# Patient Record
Sex: Female | Born: 1970 | Race: White | Hispanic: No | Smoking: Never smoker
Health system: Southern US, Community
[De-identification: ages and names within clinical notes are randomized; demographics above are authoritative.]

## PROBLEM LIST (undated history)

## (undated) DIAGNOSIS — R200 Anesthesia of skin: Secondary | ICD-10-CM

## (undated) HISTORY — DX: Anesthesia of skin: R20.0

---

## 1997-12-30 ENCOUNTER — Other Ambulatory Visit: Admission: RE | Admit: 1997-12-30 | Discharge: 1997-12-30 | Payer: Self-pay | Admitting: Obstetrics and Gynecology

## 1998-02-11 ENCOUNTER — Inpatient Hospital Stay (HOSPITAL_COMMUNITY): Admission: AD | Admit: 1998-02-11 | Discharge: 1998-02-11 | Payer: Self-pay | Admitting: Gynecology

## 1998-07-28 ENCOUNTER — Inpatient Hospital Stay (HOSPITAL_COMMUNITY): Admission: AD | Admit: 1998-07-28 | Discharge: 1998-07-30 | Payer: Self-pay | Admitting: Obstetrics and Gynecology

## 1999-08-19 ENCOUNTER — Other Ambulatory Visit: Admission: RE | Admit: 1999-08-19 | Discharge: 1999-08-19 | Payer: Self-pay | Admitting: Obstetrics and Gynecology

## 2001-10-20 ENCOUNTER — Encounter: Payer: Self-pay | Admitting: Obstetrics and Gynecology

## 2001-10-20 ENCOUNTER — Ambulatory Visit (HOSPITAL_COMMUNITY): Admission: RE | Admit: 2001-10-20 | Discharge: 2001-10-20 | Payer: Self-pay | Admitting: Obstetrics and Gynecology

## 2001-10-31 ENCOUNTER — Other Ambulatory Visit: Admission: RE | Admit: 2001-10-31 | Discharge: 2001-10-31 | Payer: Self-pay | Admitting: Obstetrics and Gynecology

## 2002-02-16 ENCOUNTER — Observation Stay (HOSPITAL_COMMUNITY): Admission: AD | Admit: 2002-02-16 | Discharge: 2002-02-17 | Payer: Self-pay | Admitting: Obstetrics and Gynecology

## 2002-02-18 ENCOUNTER — Inpatient Hospital Stay (HOSPITAL_COMMUNITY): Admission: AD | Admit: 2002-02-18 | Discharge: 2002-02-18 | Payer: Self-pay | Admitting: Obstetrics and Gynecology

## 2002-03-05 ENCOUNTER — Inpatient Hospital Stay (HOSPITAL_COMMUNITY): Admission: AD | Admit: 2002-03-05 | Discharge: 2002-03-05 | Payer: Self-pay | Admitting: Obstetrics and Gynecology

## 2002-03-15 ENCOUNTER — Inpatient Hospital Stay (HOSPITAL_COMMUNITY): Admission: AD | Admit: 2002-03-15 | Discharge: 2002-03-17 | Payer: Self-pay | Admitting: Obstetrics and Gynecology

## 2002-12-20 ENCOUNTER — Inpatient Hospital Stay (HOSPITAL_COMMUNITY): Admission: AD | Admit: 2002-12-20 | Discharge: 2002-12-21 | Payer: Self-pay | Admitting: Obstetrics and Gynecology

## 2005-05-11 ENCOUNTER — Emergency Department (HOSPITAL_COMMUNITY): Admission: EM | Admit: 2005-05-11 | Discharge: 2005-05-11 | Payer: Self-pay | Admitting: Emergency Medicine

## 2006-10-12 ENCOUNTER — Inpatient Hospital Stay: Payer: Self-pay | Admitting: Obstetrics and Gynecology

## 2008-03-17 IMAGING — CR DG CHEST 2V
2 series · 2 of 2 positions shown · non-contrast
Comparison: None.

CLINICAL DATA: 34 year-old-female with cough, fever. Flu symptoms. 
 CHEST - 2 VIEW ? 05/11/05:

[w chest pa]
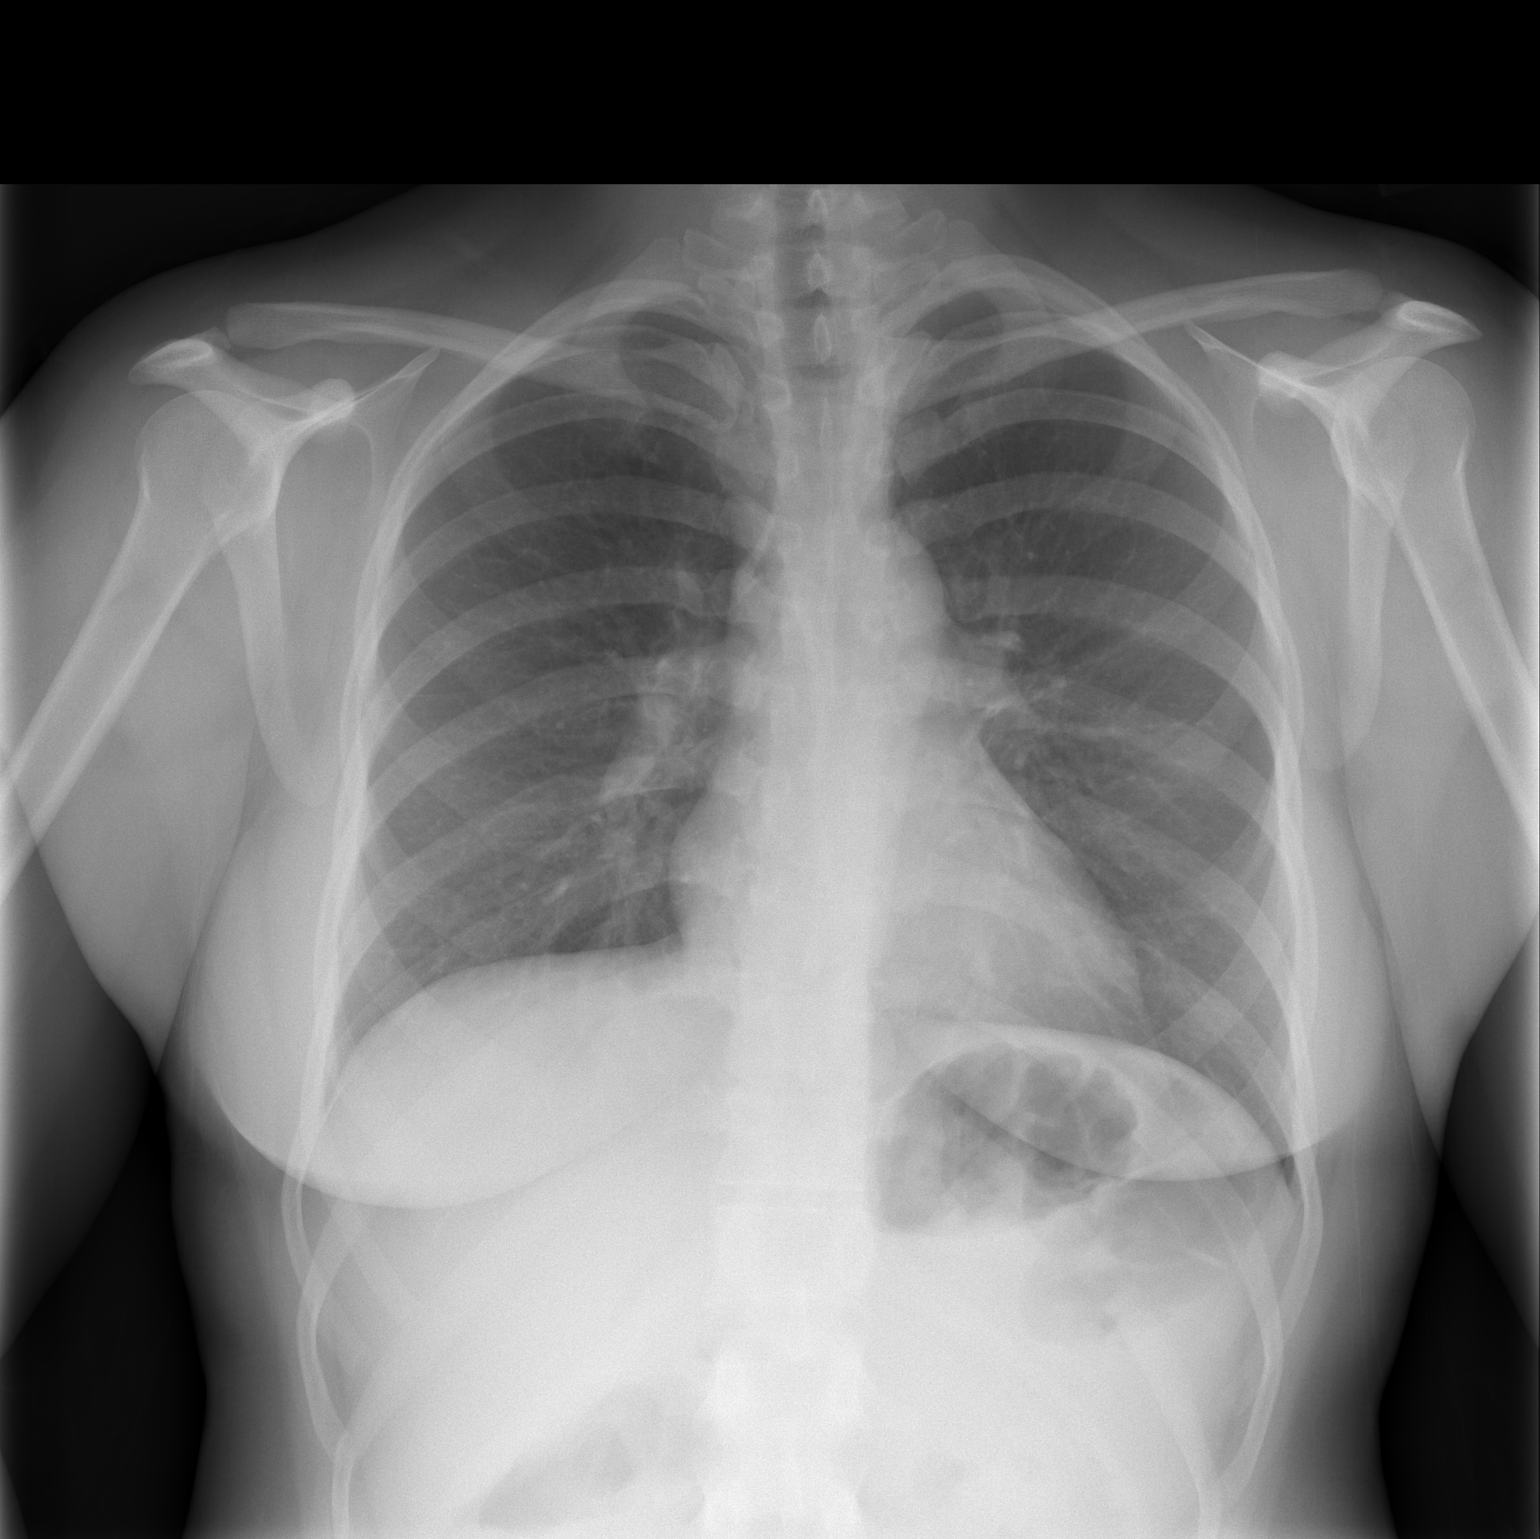

[w chest lat]
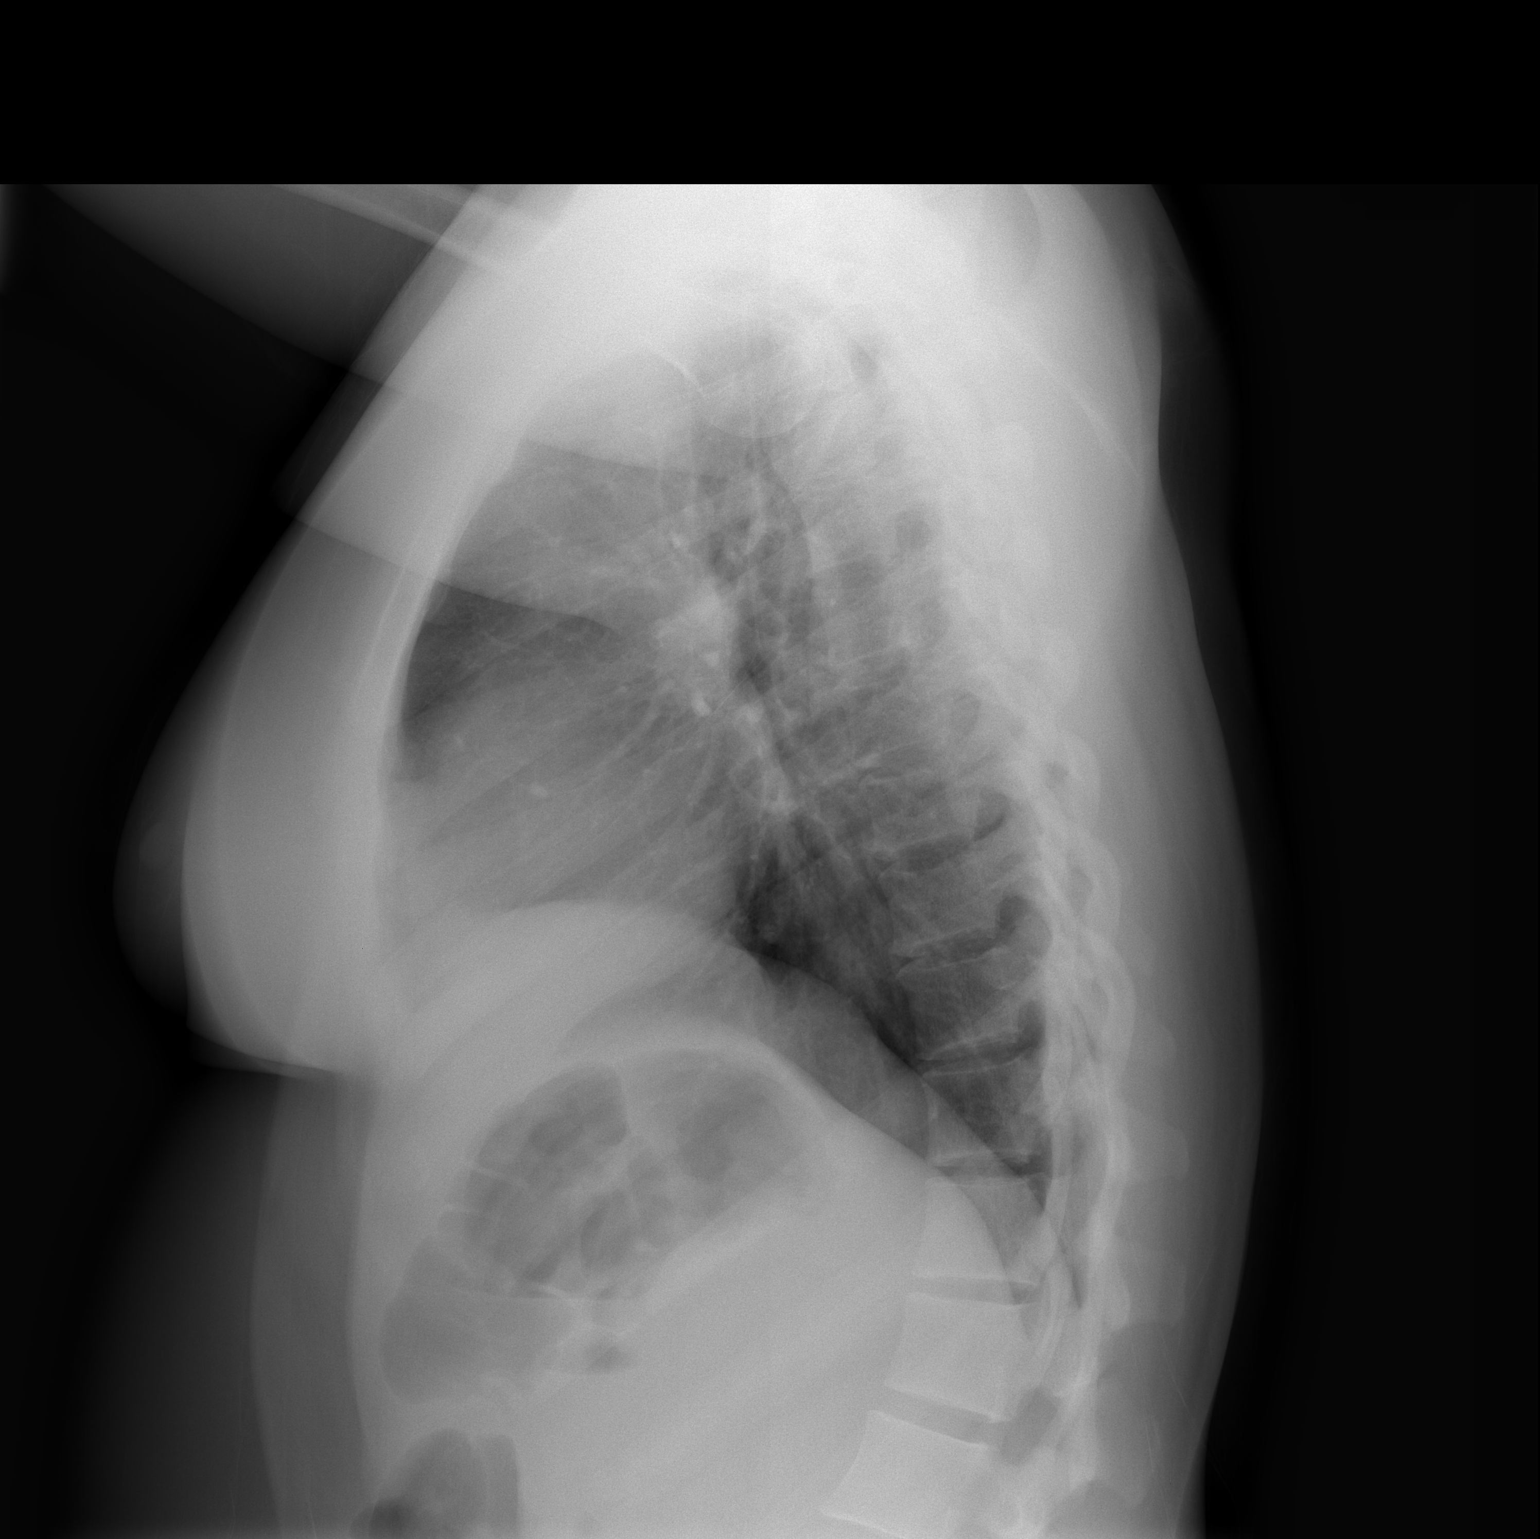

[2 of 2 positions shown; findings below may reference images not displayed]

FINDINGS: The cardiac silhouette, mediastinal and hilar contours are within normal limits.  The lungs are clear.  Bony structures are intact.
IMPRESSION: No acute cardiopulmonary findings.

## 2015-08-25 ENCOUNTER — Emergency Department (HOSPITAL_COMMUNITY)
Admission: EM | Admit: 2015-08-25 | Discharge: 2015-08-25 | Disposition: A | Discharge status: Home / Self Care | Payer: Self-pay | Attending: Emergency Medicine | Admitting: Emergency Medicine

## 2015-08-25 ENCOUNTER — Emergency Department (HOSPITAL_COMMUNITY): Payer: Self-pay

## 2015-08-25 ENCOUNTER — Encounter (HOSPITAL_COMMUNITY): Payer: Self-pay | Admitting: Emergency Medicine

## 2015-08-25 DIAGNOSIS — R002 Palpitations: Secondary | ICD-10-CM | POA: Insufficient documentation

## 2015-08-25 DIAGNOSIS — R51 Headache: Secondary | ICD-10-CM | POA: Insufficient documentation

## 2015-08-25 DIAGNOSIS — R202 Paresthesia of skin: Secondary | ICD-10-CM | POA: Insufficient documentation

## 2015-08-25 DIAGNOSIS — R519 Headache, unspecified: Secondary | ICD-10-CM

## 2015-08-25 LAB — BASIC METABOLIC PANEL
Anion gap: 9 (ref 5–15)
BUN: 12 mg/dL (ref 6–20)
CALCIUM: 9.3 mg/dL (ref 8.9–10.3)
CO2: 20 mmol/L — AB (ref 22–32)
CREATININE: 0.77 mg/dL (ref 0.44–1.00)
Chloride: 108 mmol/L (ref 101–111)
GFR calc Af Amer: >60 mL/min (ref >60)
GFR calc non Af Amer: >60 mL/min (ref >60)
GLUCOSE: 127 mg/dL — AB (ref 65–99)
Potassium: 3.8 mmol/L (ref 3.5–5.1)
Sodium: 137 mmol/L (ref 135–145)

## 2015-08-25 LAB — I-STAT TROPONIN, ED: TROPONIN I, POC: 0 ng/mL (ref 0.00–0.08)

## 2015-08-25 LAB — CBC
HCT: 40.6 % (ref 36.0–46.0)
Hemoglobin: 12.8 g/dL (ref 12.0–15.0)
MCH: 29 pg (ref 26.0–34.0)
MCHC: 31.5 g/dL (ref 30.0–36.0)
MCV: 92.1 fL (ref 78.0–100.0)
PLATELETS: 318 10*3/uL (ref 150–400)
RBC: 4.41 MIL/uL (ref 3.87–5.11)
RDW: 13.7 % (ref 11.5–15.5)
WBC: 12.9 10*3/uL — ABNORMAL HIGH (ref 4.0–10.5)

## 2015-08-25 MED ORDER — SODIUM CHLORIDE 0.9 % IV BOLUS (SEPSIS)
1000.0000 mL | Freq: Once | INTRAVENOUS | Status: AC
  Administered 2015-08-25: 1000 mL via INTRAVENOUS

## 2015-08-25 MED ORDER — KETOROLAC TROMETHAMINE 15 MG/ML IJ SOLN
15.0000 mg | Freq: Once | INTRAMUSCULAR | Status: AC
  Administered 2015-08-25: 15 mg via INTRAVENOUS
  Filled 2015-08-25: qty 1

## 2015-08-25 NOTE — ED Notes (Signed)
Pt taken to xray 

## 2015-08-25 NOTE — ED Notes (Signed)
Pt. Finishing her fluid bolus and will be d/c afterwards.

## 2015-08-25 NOTE — ED Provider Notes (Signed)
[facility]EM[facility]ENCY DEPT Provider Note   CSN: [id number] Arrival date & time: 08/25/15  [id number]  First Provider Contact:  None       History   Chief Complaint Chief Complaint  Patient presents with  . Other    Chest fluttering, bilateral arm numbness    HPI Alyssa Moss is a 45 y.o. female.  HPI  This is a 45 year old female with no significant past medical history who presents with palpitations and arm tingling. Patient reports that she woke up out of sleep and noted bilateral hand and arm tingling. She states that she got anxious and felt a fluttering in her left chest and dizziness. She's never had symptoms like this before. She denies room spinning dizziness. She states off balance. She denies symptoms before going to bed. She denies any recent illness. She does report headache.  She reports that she sometimes gets headaches. Current headache is 5 out of 10. She's not taken anything for her headache. She denies any chest pain or shortness of breath during this episode. She denies any weakness.  History reviewed. No pertinent past medical history.  There are no active problems to display for this patient.   History reviewed. No pertinent surgical history.  OB History    No data available       Home Medications    Prior to Admission medications   Not on File    Family History No family history on file.  Social History Social History  Substance Use Topics  . Smoking status: Never Smoker  . Smokeless tobacco: Never Used  . Alcohol use No     Allergies   Review of patient's allergies indicates no known allergies.   Review of Systems Review of Systems  Constitutional: Negative for fever.  Eyes: Positive for photophobia.  Respiratory: Negative for chest tightness and shortness of breath.   Cardiovascular: Positive for palpitations. Negative for chest pain.  Gastrointestinal: Negative for abdominal pain, nausea and vomiting.  Musculoskeletal: Negative for neck  pain.  Neurological: Positive for headaches.       Tingling  All other systems reviewed and are negative.    Physical Exam Updated Vital Signs BP 107/76   Pulse 65   Temp 98.5 F (36.9 C) (Oral)   Resp 18   Ht 5\' 5"  (1.651 m)   Wt 214 lb 4 oz (97.2 kg)   LMP 08/01/2015 (Exact Date)   SpO2 99%   BMI 35.65 kg/m   Physical Exam  Constitutional: She is oriented to person, place, and time. She appears well-developed and well-nourished. No distress.  HENT:  Head: Normocephalic and atraumatic.  Eyes: Pupils are equal, round, and reactive to light.  Neck: Normal range of motion. Neck supple.  Cardiovascular: Normal rate, regular rhythm and normal heart sounds.   Pulmonary/Chest: Effort normal and breath sounds normal. No respiratory distress. She has no wheezes.  Abdominal: Soft. There is no tenderness.  Neurological: She is alert and oriented to person, place, and time.  Cranial nerves II through XII intact, 5 out of 5 strength in all 4 extremities, normal gait  Skin: Skin is warm and dry.  Psychiatric: She has a normal mood and affect.  Nursing note and vitals reviewed.    ED Treatments / Results  Labs (all labs ordered are listed, but only abnormal results are displayed) Labs Reviewed  BASIC METABOLIC PANEL - Abnormal; Notable for the following:       Result Value   CO2 20 (*)  Glucose, Bld 127 (*)    All other components within normal limits  CBC - Abnormal; Notable for the following:    WBC 12.9 (*)    All other components within normal limits  I-STAT TROPOININ, ED    EKG  EKG Interpretation  Date/Time:  Monday August 25 2015 04:56:09 EDT Ventricular Rate:  92 PR Interval:  138 QRS Duration: 70 QT Interval:  350 QTC Calculation: 432 R Axis:   24 Text Interpretation:  Normal sinus rhythm Low voltage QRS Cannot rule out Anterior infarct , age undetermined Abnormal ECG no prior for comparison Confirmed by Jowana Thumma  MD, Sarrah Fiorenza ([id number]) on 08/25/2015 5:06:59  AM       Radiology Dg Chest 2 View  Result Date: 08/25/2015 CLINICAL DATA:  Woke up this morning gasping for breath. Migraine headache. Heart fluttering. EXAM: CHEST  2 VIEW COMPARISON:  05/11/2005 FINDINGS: The heart size and mediastinal contours are within normal limits. Both lungs are clear. The visualized skeletal structures are unremarkable. IMPRESSION: No active cardiopulmonary disease. Electronically Signed   By: Burman NievesWilliam  Stevens M.D.   On: 08/25/2015 05:43    Procedures Procedures (including critical care time)  Medications Ordered in ED Medications  sodium chloride 0.9 % bolus 1,000 mL (1,000 mLs Intravenous New Bag/Given 08/25/15 0644)  ketorolac (TORADOL) 15 MG/ML injection 15 mg (15 mg Intravenous Given 08/25/15 0644)     Initial Impression / Assessment and Plan / ED Course  I have reviewed the triage vital signs and the nursing notes.  Pertinent labs & imaging results that were available during my care of the patient were reviewed by me and considered in my medical decision making (see chart for details).  Clinical Course    Patient presents with peers seizures, palpitations, and headache. This was upon awakening. Denies any chest pain or shortness of breath with this event. He reports that symptoms may have been related to anxiety; however, she has never had any symptoms like this previously. Vital signs are reassuring. Orthostatics are negative. No arrhythmia noted.  Neurologically intact. Patient was given fluids and Toradol for her headache and dizziness. Paresthesias have improved. They're bilateral. Doubt stroke or cervical pathology.  Patient reports that she feels much better. We'll have her follow-up with her primary physician.  After history, exam, and medical workup I feel the patient has been appropriately medically screened and is safe for discharge home. Pertinent diagnoses were discussed with the patient. Patient was given return precautions.   Final  Clinical Impressions(s) / ED Diagnoses   Final diagnoses:  Paresthesia  Palpitations  Acute nonintractable headache, unspecified headache type    New Prescriptions New Prescriptions   No medications on file     Shon Batonourtney F Alizandra Loh, MD 08/25/15 [phone]

## 2015-08-25 NOTE — ED Triage Notes (Signed)
Pt reports she woke up just prior to arrival with "fluttering" feeling to L chest, bilateral arm numbness, blurred vision to bilateral eyes, and feeling dizzy.  Denies chest pain.  Reports she went to bed without any symptoms at 10pm.

## 2015-08-25 NOTE — Discharge Instructions (Signed)
You were seen today for palpitations and tingling in your fingers. He also had a headache. Your workup is reassuring. This may be related to underlying anxiety; however, if you have recurrence of symptoms or persistent symptoms she needs to see her primary physician for recheck.

## 2016-06-16 ENCOUNTER — Emergency Department (HOSPITAL_COMMUNITY)
Admission: EM | Admit: 2016-06-16 | Discharge: 2016-06-16 | Disposition: A | Discharge status: Home / Self Care | Payer: Medicaid Other | Attending: Emergency Medicine | Admitting: Emergency Medicine

## 2016-06-16 ENCOUNTER — Encounter (HOSPITAL_COMMUNITY): Payer: Self-pay

## 2016-06-16 DIAGNOSIS — H9191 Unspecified hearing loss, right ear: Secondary | ICD-10-CM | POA: Insufficient documentation

## 2016-06-16 DIAGNOSIS — Z5321 Procedure and treatment not carried out due to patient leaving prior to being seen by health care provider: Secondary | ICD-10-CM | POA: Insufficient documentation

## 2016-06-16 DIAGNOSIS — R200 Anesthesia of skin: Secondary | ICD-10-CM | POA: Diagnosis not present

## 2016-06-16 NOTE — ED Triage Notes (Addendum)
Pt endorses having "muffled sound in right ear like there is a cotton ball in it" with numbness around the ear. No neuro deficits. VSS. Pt has no medical hx. Denies dizziness.

## 2016-06-16 NOTE — ED Notes (Signed)
Pt left post triage. Pt came to desk and asked about referrals to a primary Dr, this RN informed her that she has to see the dr first. Pt states "i'm just going to find a pcp and see if they can tell me what's going on" Pt ambulatory and appears to be in NAD.

## 2016-06-30 ENCOUNTER — Emergency Department (HOSPITAL_COMMUNITY)
Admission: EM | Admit: 2016-06-30 | Discharge: 2016-06-30 | Disposition: A | Discharge status: Home / Self Care | Payer: Medicaid Other | Attending: Emergency Medicine | Admitting: Emergency Medicine

## 2016-06-30 ENCOUNTER — Encounter (HOSPITAL_COMMUNITY): Payer: Self-pay | Admitting: Emergency Medicine

## 2016-06-30 ENCOUNTER — Encounter: Payer: Self-pay | Admitting: Neurology

## 2016-06-30 DIAGNOSIS — R202 Paresthesia of skin: Secondary | ICD-10-CM | POA: Insufficient documentation

## 2016-06-30 DIAGNOSIS — R51 Headache: Secondary | ICD-10-CM | POA: Insufficient documentation

## 2016-06-30 DIAGNOSIS — R200 Anesthesia of skin: Secondary | ICD-10-CM

## 2016-06-30 DIAGNOSIS — H9311 Tinnitus, right ear: Secondary | ICD-10-CM | POA: Insufficient documentation

## 2016-06-30 LAB — CBC WITH DIFFERENTIAL/PLATELET
Basophils Absolute: 0 10*3/uL (ref 0.0–0.1)
Basophils Relative: 0 %
EOS ABS: 0.1 10*3/uL (ref 0.0–0.7)
Eosinophils Relative: 1 %
HCT: 38.7 % (ref 36.0–46.0)
HEMOGLOBIN: 12 g/dL (ref 12.0–15.0)
LYMPHS ABS: 4 10*3/uL (ref 0.7–4.0)
Lymphocytes Relative: 30 %
MCH: 28.6 pg (ref 26.0–34.0)
MCHC: 31 g/dL (ref 30.0–36.0)
MCV: 92.1 fL (ref 78.0–100.0)
MONOS PCT: 4 %
Monocytes Absolute: 0.6 10*3/uL (ref 0.1–1.0)
NEUTROS PCT: 65 %
Neutro Abs: 8.8 10*3/uL — ABNORMAL HIGH (ref 1.7–7.7)
Platelets: 368 10*3/uL (ref 150–400)
RBC: 4.2 MIL/uL (ref 3.87–5.11)
RDW: 14.5 % (ref 11.5–15.5)
WBC: 13.5 10*3/uL — ABNORMAL HIGH (ref 4.0–10.5)

## 2016-06-30 LAB — COMPREHENSIVE METABOLIC PANEL
ALK PHOS: 90 U/L (ref 38–126)
ALT: 16 U/L (ref 14–54)
ANION GAP: 8 (ref 5–15)
AST: 19 U/L (ref 15–41)
Albumin: 3.4 g/dL — ABNORMAL LOW (ref 3.5–5.0)
BILIRUBIN TOTAL: 0.4 mg/dL (ref 0.3–1.2)
BUN: 16 mg/dL (ref 6–20)
CALCIUM: 8.8 mg/dL — AB (ref 8.9–10.3)
CO2: 24 mmol/L (ref 22–32)
Chloride: 105 mmol/L (ref 101–111)
Creatinine, Ser: 0.86 mg/dL (ref 0.44–1.00)
GFR calc non Af Amer: >60 mL/min (ref >60)
Glucose, Bld: 128 mg/dL — ABNORMAL HIGH (ref 65–99)
Potassium: 3.6 mmol/L (ref 3.5–5.1)
SODIUM: 137 mmol/L (ref 135–145)
TOTAL PROTEIN: 7.3 g/dL (ref 6.5–8.1)

## 2016-06-30 NOTE — ED Provider Notes (Signed)
[facility] Provider Note   CSN: [id number] Arrival date & time: 06/30/16  0212     History   Chief Complaint Chief Complaint  Patient presents with  . Numbness    HPI Alyssa StakesLinda D Moss is a 46 y.o. female who presents with a c/o numbness. The patient states that 1 week ago while watching TV she had sudden onset of ringing in her R ear followed by numbness in the trigeminal nerve distribution and the surrounding the ear and pinna itself along with the Top surface of her tongue. She states that it lasted about 1 hour and then resolved. She had no other neuro sxs.  Today the patient woke around 1:30 AM with the same sxs. She has an associated "slight headache"  But denies a hx of migraines. No recent illnesses or vaccinations. The patient has not seen a pcp in 7 years but denies only contributory pmh. The patient denies any facial droop, changes in vision, weakness, vertigo. Denies fevers, chills, myalgias, arthralgias. Denies DOE, SOB, chest tightness or pressure, radiation to left arm, jaw or back, or diaphoresis. Denies dysuria, flank pain, suprapubic pain, frequency, urgency, or hematuria.  Denies abdominal pain, nausea, vomiting, diarrhea or constipation.   HPI  History reviewed. No pertinent past medical history.  There are no active problems to display for this patient.   History reviewed. No pertinent surgical history.  OB History    No data available       Home Medications    Prior to Admission medications   Not on File    Family History History reviewed. No pertinent family history.  Social History Social History  Substance Use Topics  . Smoking status: Never Smoker  . Smokeless tobacco: Never Used  . Alcohol use No     Allergies   Patient has no known allergies.   Review of Systems Review of Systems Ten systems reviewed and are negative for acute change, except as noted in the HPI.    Physical Exam Updated Vital Signs BP (!) 113/55   Pulse  (!) 56   Temp 98.7 F (37.1 C) (Oral)   Resp 16   SpO2 97%   Physical Exam  Constitutional: She is oriented to person, place, and time. She appears well-developed and well-nourished. No distress.  HENT:  Head: Normocephalic and atraumatic.  Right Ear: External ear normal.  Left Ear: External ear normal.  TM normal BL  Eyes: Conjunctivae and EOM are normal. Pupils are equal, round, and reactive to light. No scleral icterus.  Neck: Normal range of motion.  Cardiovascular: Normal rate, regular rhythm and normal heart sounds.  Exam reveals no gallop and no friction rub.   No murmur heard. Pulmonary/Chest: Effort normal and breath sounds normal. No respiratory distress.  Abdominal: Soft. Bowel sounds are normal. She exhibits no distension and no mass. There is no tenderness. There is no guarding.  Neurological: She is alert and oriented to person, place, and time.  Speech is clear and goal oriented, follows commands Major Cranial nerves without deficit, no facial droop Normal strength in upper and lower extremities bilaterally including dorsiflexion and plantar flexion, strong and equal grip strength  REPORTS ABNORMAL SENSATION TO LIGHT TOUCH ON THE LEFT EAR AND THE V1 DISTRIBUTION OF THE TRIGEMINAL NERVE.  Moves extremities without ataxia, coordination intact Normal finger to nose and rapid alternating movements Neg romberg, no pronator drift Normal gait Normal heel-shin and balance   Skin: Skin is warm and dry. She is not diaphoretic.  Nursing note and vitals reviewed.    ED Treatments / Results  Labs (all labs ordered are listed, but only abnormal results are displayed) Labs Reviewed  CBC WITH DIFFERENTIAL/PLATELET - Abnormal; Notable for the following:       Result Value   WBC 13.5 (*)    Neutro Abs 8.8 (*)    All other components within normal limits  COMPREHENSIVE METABOLIC PANEL - Abnormal; Notable for the following:    Glucose, Bld 128 (*)    Calcium 8.8 (*)     Albumin 3.4 (*)    All other components within normal limits    EKG  EKG Interpretation None       Radiology No results found.  Procedures Procedures (including critical care time)  Medications Ordered in ED Medications - No data to display   Initial Impression / Assessment and Plan / ED Course  I have reviewed the triage vital signs and the nursing notes.  Pertinent labs & imaging results that were available during my care of the patient were reviewed by me and considered in my medical decision making (see chart for details).      patient with Neuro complaints. Neuro exam is without abnormality.  No Evidence of zoster. No vertigo. She needs close OP f/u with Neuro.  DDX includes schwannoma. Patient seen in shared visit with attending physician. Who agrees with assessment, work up , treatment, and plan for discharge    Final Clinical Impressions(s) / ED Diagnoses   Final diagnoses:  Numbness    New Prescriptions There are no discharge medications for this patient.    Arthor Captain, PA-C 07/10/16 1347    Derwood Kaplan, MD 07/11/16 [phone]

## 2016-06-30 NOTE — ED Triage Notes (Signed)
Patient with facial numbness on the right side of her face.  She states that she woke up with the numbness.  Patient went to bed around 2230.  Patient states that her hearing is muffled.  GCS of 15, no slurred speech, no neuro deficits.

## 2016-06-30 NOTE — Discharge Instructions (Signed)
Contact a health care provider if: °You continue to have episodes of paresthesia. °Your burning or prickling feeling gets worse when you walk. °You have pain, cramps, or dizziness. °You develop a rash. °Get help right away if: °You feel weak. °You have trouble walking or moving. °You have problems with speech, understanding, or vision. °You feel confused. °You cannot control your bladder or bowel movements. °You have numbness after an injury. °You faint. °

## 2016-07-19 ENCOUNTER — Telehealth: Payer: Self-pay | Admitting: Neurology

## 2016-07-19 ENCOUNTER — Ambulatory Visit (INDEPENDENT_AMBULATORY_CARE_PROVIDER_SITE_OTHER): Payer: Medicaid Other | Admitting: Neurology

## 2016-07-19 ENCOUNTER — Encounter: Payer: Self-pay | Admitting: Neurology

## 2016-07-19 ENCOUNTER — Other Ambulatory Visit: Payer: Self-pay | Admitting: *Deleted

## 2016-07-19 VITALS — BP 100/70 | HR 86 | Ht 65.0 in | Wt 215.1 lb

## 2016-07-19 DIAGNOSIS — R200 Anesthesia of skin: Secondary | ICD-10-CM | POA: Diagnosis not present

## 2016-07-19 DIAGNOSIS — G509 Disorder of trigeminal nerve, unspecified: Secondary | ICD-10-CM

## 2016-07-19 NOTE — Progress Notes (Signed)
[facility] Neurology [facility] Clinic Note - Initial Visit   Date: 07/19/16  Alyssa StakesLinda D Moss MRN: [id number] DOB: 1970-12-14   Dear Dr. Rhunette CroftNanavati  Thank you for your kind referral of Alyssa StakesLinda D Moss for consultation of right facial numbness. Although her history is well known to you, please allow us to reiterate it for the purpose of our medical record. The patient was accompanied to the clinic by self.   History of Present Illness: Alyssa Moss is a 46 y.o. right-handed Caucasian female with no known medical problems presenting for evaluation of right facial numbness.    Starting around April 2018, she reports having muffled hearing, as if there is something in her ear.  She has numbness over the right side of her face involving the cheek, forehead, corner of her lip and behind the ear.  When numbness is present, she cannot sense a comb over the right temporal region, or if she tries the pull the hair, she is numb to it. Symptoms are intermittent, occurring about 3-4 times per week, lasting from several hours to 3-4 days.  The first time it occurred, she was sitting and watching TV.  About 3 days later, it happened again and she became fearful and went to the ER whose notes were reviewed.  She sometimes has numbness of the tongue.  No difficulty with swallowing, drooping of the eye, double vision, or limb weakness.   She has point tenderness over the right temple region.  She also complains of right frontal headaches for the past 6 months which also occur 3-4 times, which lasts all day.  She does not have numbness of the face along with the headaches.   She denies any nausea, vomiting, photophobia, phonophobia.  She saw her eye doctor a few months ago whose evaluation was normal.     Past Medical History:  Diagnosis Date  . Facial numbness    Past Surgical History:  None  Medications:  Outpatient Encounter Prescriptions as of 07/19/2016  Medication Sig  . ibuprofen (ADVIL,MOTRIN)  200 MG tablet Take 200 mg by mouth every 6 (six) hours as needed.   No facility-administered encounter medications on file as of 07/19/2016.      Allergies: No Known Allergies  Family History: Family History  Problem Relation Age of Onset  . Stroke Mother [id number]  . Diabetes Mother   . High blood pressure Father   . Healthy Sister   . Diabetes Maternal Grandmother   . Brain cancer Maternal Uncle   . Cancer Maternal Uncle     Social History: Social History  Substance Use Topics  . Smoking status: Never Smoker  . Smokeless tobacco: Never Used  . Alcohol use 1.2 oz/week    2 Glasses of wine per week   Social History   Social History Narrative   Lives with dad in a 2 story home.  Has 5 children.  Works in [employer] caf[employer] at [employer].  Education: some college.      Review of Systems:  CONSTITUTIONAL: No fevers, chills, night sweats, or weight loss.   EYES: No visual changes or eye pain ENT: No hearing changes.  No history of nose bleeds.   RESPIRATORY: No cough, wheezing and shortness of breath.   CARDIOVASCULAR: Negative for chest pain, and palpitations.   GI: Negative for abdominal discomfort, blood in stools or black stools.  No recent change in bowel habits.   GU:  No history of incontinence.   MUSCLOSKELETAL: No history of joint  pain or swelling.  No myalgias.   SKIN: Negative for lesions, rash, and itching.   HEMATOLOGY/ONCOLOGY: Negative for prolonged bleeding, bruising easily, and swollen nodes.  No history of cancer.   ENDOCRINE: Negative for cold or heat intolerance, polydipsia or goiter.   PSYCH:  No depression or anxiety symptoms.   NEURO: As Above.   Vital Signs:  BP 100/70   Pulse 86   Ht 5\' 5"  (1.651 m)   Wt 215 lb 1 oz (97.6 kg)   SpO2 98%   BMI 35.79 kg/m  Pain Scale: 0 on a scale of 0-10   General Medical Exam:   General:  Well appearing, comfortable.   Eyes/ENT: see cranial nerve examination.   Neck: No masses appreciated.  Full range of motion without  tenderness.  No carotid bruits. Respiratory:  Clear to auscultation, good air entry bilaterally.   Cardiac:  Regular rate and rhythm, no murmur.   Extremities:  No deformities, edema, or skin discoloration.  Skin:  No rashes or lesions.  Neurological Exam: MENTAL STATUS including orientation to time, place, person, recent and remote memory, attention span and concentration, language, and fund of knowledge is normal.  Speech is not dysarthric.  CRANIAL NERVES: II:  No visual field defects.  Unremarkable fundi.   III-IV-VI: Pupils equal round and reactive to light.  Normal conjugate, extra-ocular eye movements in all directions of gaze.  No nystagmus.  No ptosis.   V:  Hyperesthesia to temperature over the right V2 > V1/V3 distribution.  Normal light touch and pin prick. Jaw jerk is absent.   VII:  Normal facial symmetry and movements.  No pathologic facial reflexes.  VIII:  Normal hearing and vestibular function.   IX-X:  Normal palatal movement.   XI:  Normal shoulder shrug and head rotation.   XII:  Normal tongue strength and range of motion, no deviation or fasciculation.  MOTOR:  No atrophy, fasciculations or abnormal movements.  No pronator drift.  Tone is normal.    Right Upper Extremity:    Left Upper Extremity:    Deltoid  5/5   Deltoid  5/5   Biceps  5/5   Biceps  5/5   Triceps  5/5   Triceps  5/5   Wrist extensors  5/5   Wrist extensors  5/5   Wrist flexors  5/5   Wrist flexors  5/5   Finger extensors  5/5   Finger extensors  5/5   Finger flexors  5/5   Finger flexors  5/5   Dorsal interossei  5/5   Dorsal interossei  5/5   Abductor pollicis  5/5   Abductor pollicis  5/5   Tone (Ashworth scale)  0  Tone (Ashworth scale)  0   Right Lower Extremity:    Left Lower Extremity:    Hip flexors  5/5   Hip flexors  5/5   Hip extensors  5/5   Hip extensors  5/5   Knee flexors  5/5   Knee flexors  5/5   Knee extensors  5/5   Knee extensors  5/5   Dorsiflexors  5/5   Dorsiflexors   5/5   Plantarflexors  5/5   Plantarflexors  5/5   Toe extensors  5/5   Toe extensors  5/5   Toe flexors  5/5   Toe flexors  5/5   Tone (Ashworth scale)  0  Tone (Ashworth scale)  0   MSRs:  Right  Left brachioradialis 2+  brachioradialis 2+  biceps 2+  biceps 2+  triceps 2+  triceps 2+  patellar 2+  patellar 2+  ankle jerk 2+  ankle jerk 2+  Hoffman no  Hoffman no  plantar response down  plantar response down   SENSORY:  Normal and symmetric perception of light touch, pinprick, vibration, and proprioception.   COORDINATION/GAIT: Normal finger-to- nose-finger.  Intact rapid alternating movements bilaterally.  Gait narrow based and stable. Tandem and stressed gait intact.   DATA: Lab Results  Component Value Date   ALT 16 06/30/2016   AST 19 06/30/2016   ALKPHOS 90 06/30/2016   BILITOT 0.4 06/30/2016   Lab Results  Component Value Date   NA 137 06/30/2016   K 3.6 06/30/2016   CL 105 06/30/2016   CO2 24 06/30/2016     IMPRESSION: Mrs. Demuro is a pleasant 46 year-old female referred for evaluation of right facial numbness.  Her exam shows hyperesthesia over the right V2 distribution. No facial weakness or abnormal reflexes.  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  She also complains of "muffled noise" and sensation of something in her ear, when these spells occur.  Unsure whether she is truly having hearing deficits during these spells, of if this is due to sensory disturbance from CN V.  She will have MRI brain wwo contrast with attention to trigeminal nerve to look for structural lesion.  If there is no structural pathology, I explained that she may need labs and/or spinal tap.  Results of MRI will determine the next step.  The duration of this appointment visit was 40 minutes of face-to-face time with the patient.  Greater than 50% of this time was spent in counseling,  explanation of diagnosis, planning of further management, and coordination of care.   Thank you for allowing me to participate in patient's care.  If I can answer any additional questions, I would be pleased to do so.    Sincerely,    Aleeha Boline K. Allena Katz, DO

## 2016-07-19 NOTE — Patient Instructions (Addendum)
MRI brain with and without contrast to evaluate the trigeminal nerve We will call you with the results and determine the next step

## 2016-07-19 NOTE — Telephone Encounter (Signed)
Maralyn SagoSarah from [facility] left a voicemail message in regards to an order put in for a MRI CB# [id number] OPT#1 then OPT#2

## 2016-07-29 ENCOUNTER — Other Ambulatory Visit: Payer: Self-pay | Admitting: Neurology

## 2016-07-31 ENCOUNTER — Other Ambulatory Visit: Payer: Medicaid Other

## 2016-08-11 ENCOUNTER — Ambulatory Visit: Payer: Medicaid Other | Admitting: Neurology

## 2018-07-01 IMAGING — DX DG CHEST 2V
2 series · 2 of 2 positions shown · non-contrast
Comparison: 05/11/2005

CLINICAL DATA: Woke up this morning gasping for breath. Migraine
headache. Heart fluttering.

EXAM:
CHEST  2 VIEW

[chest pa]
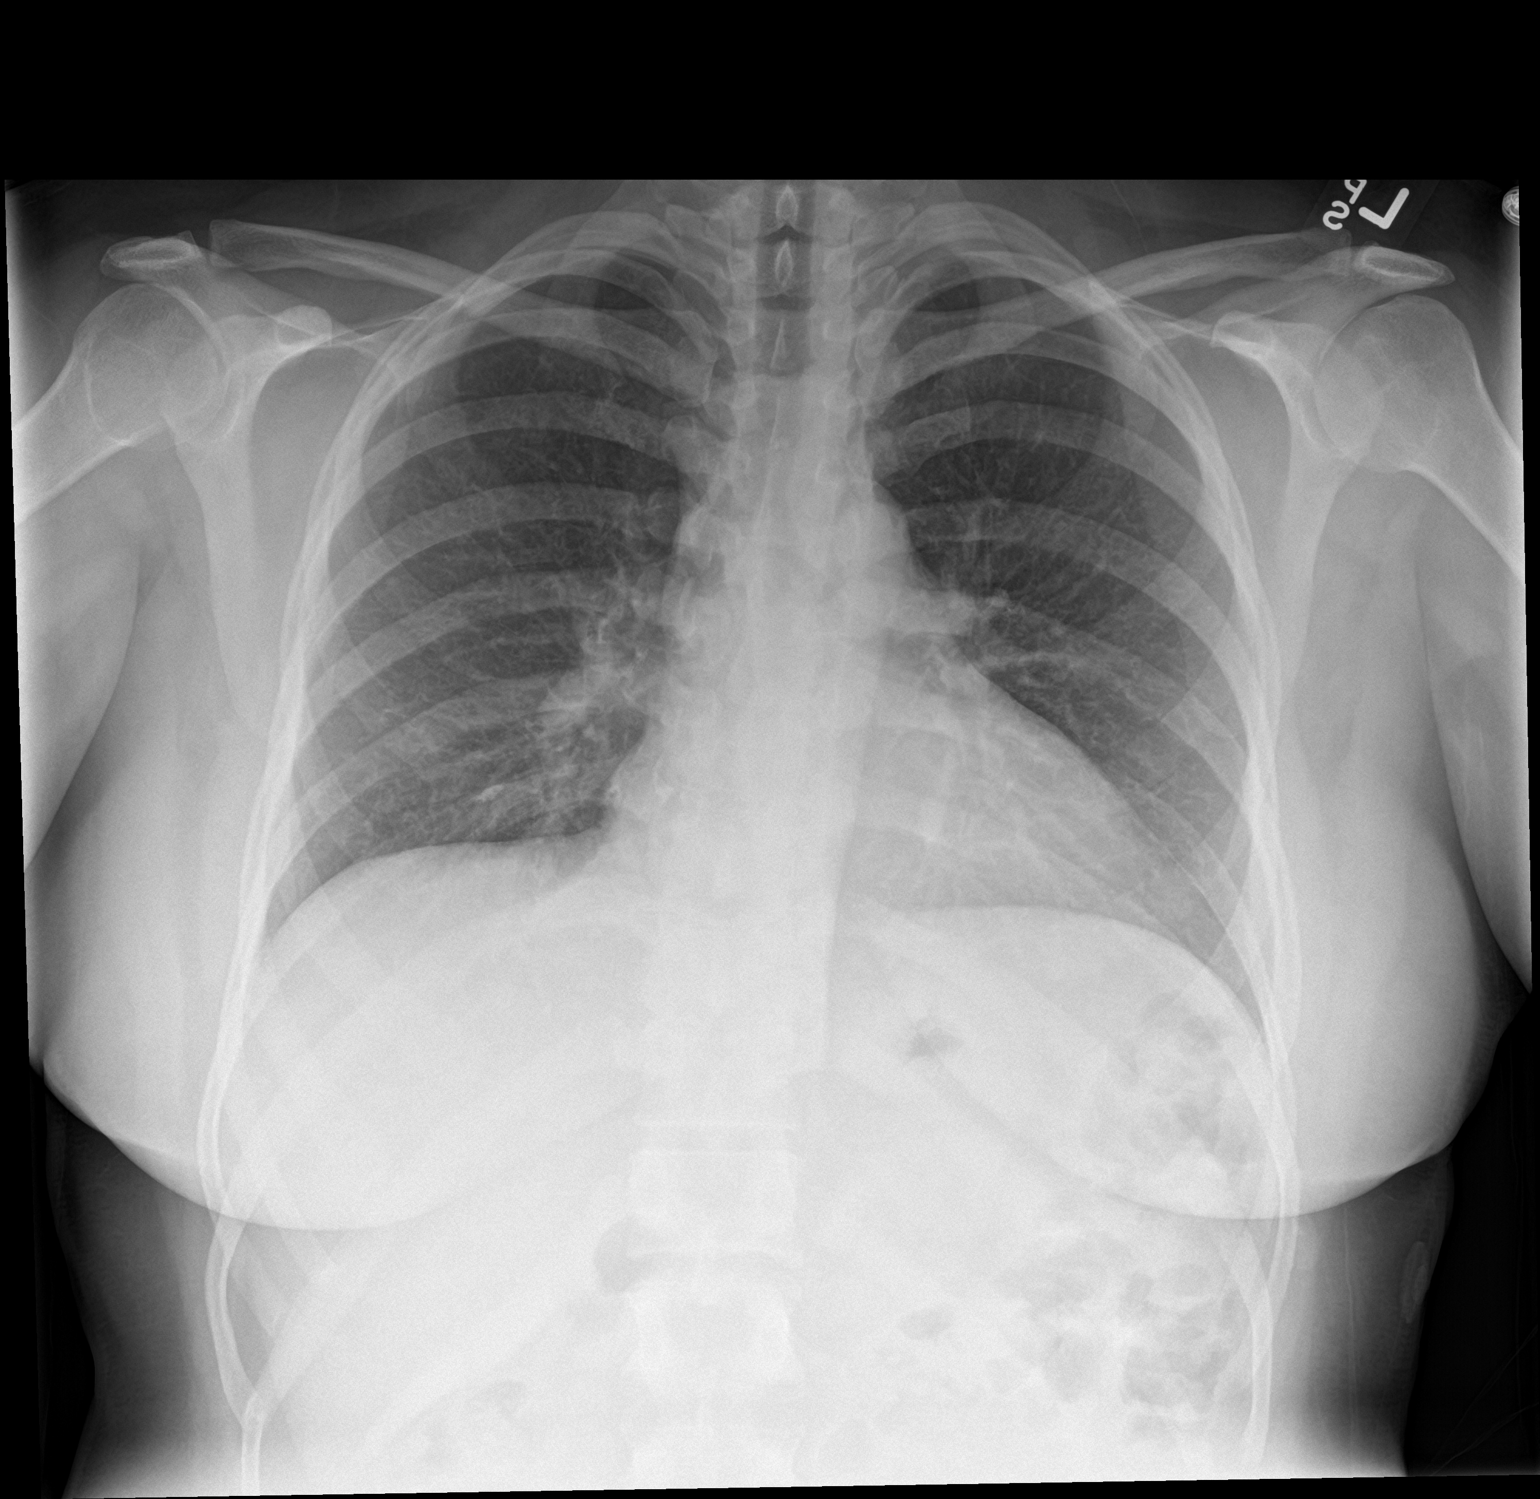

[chest lat]
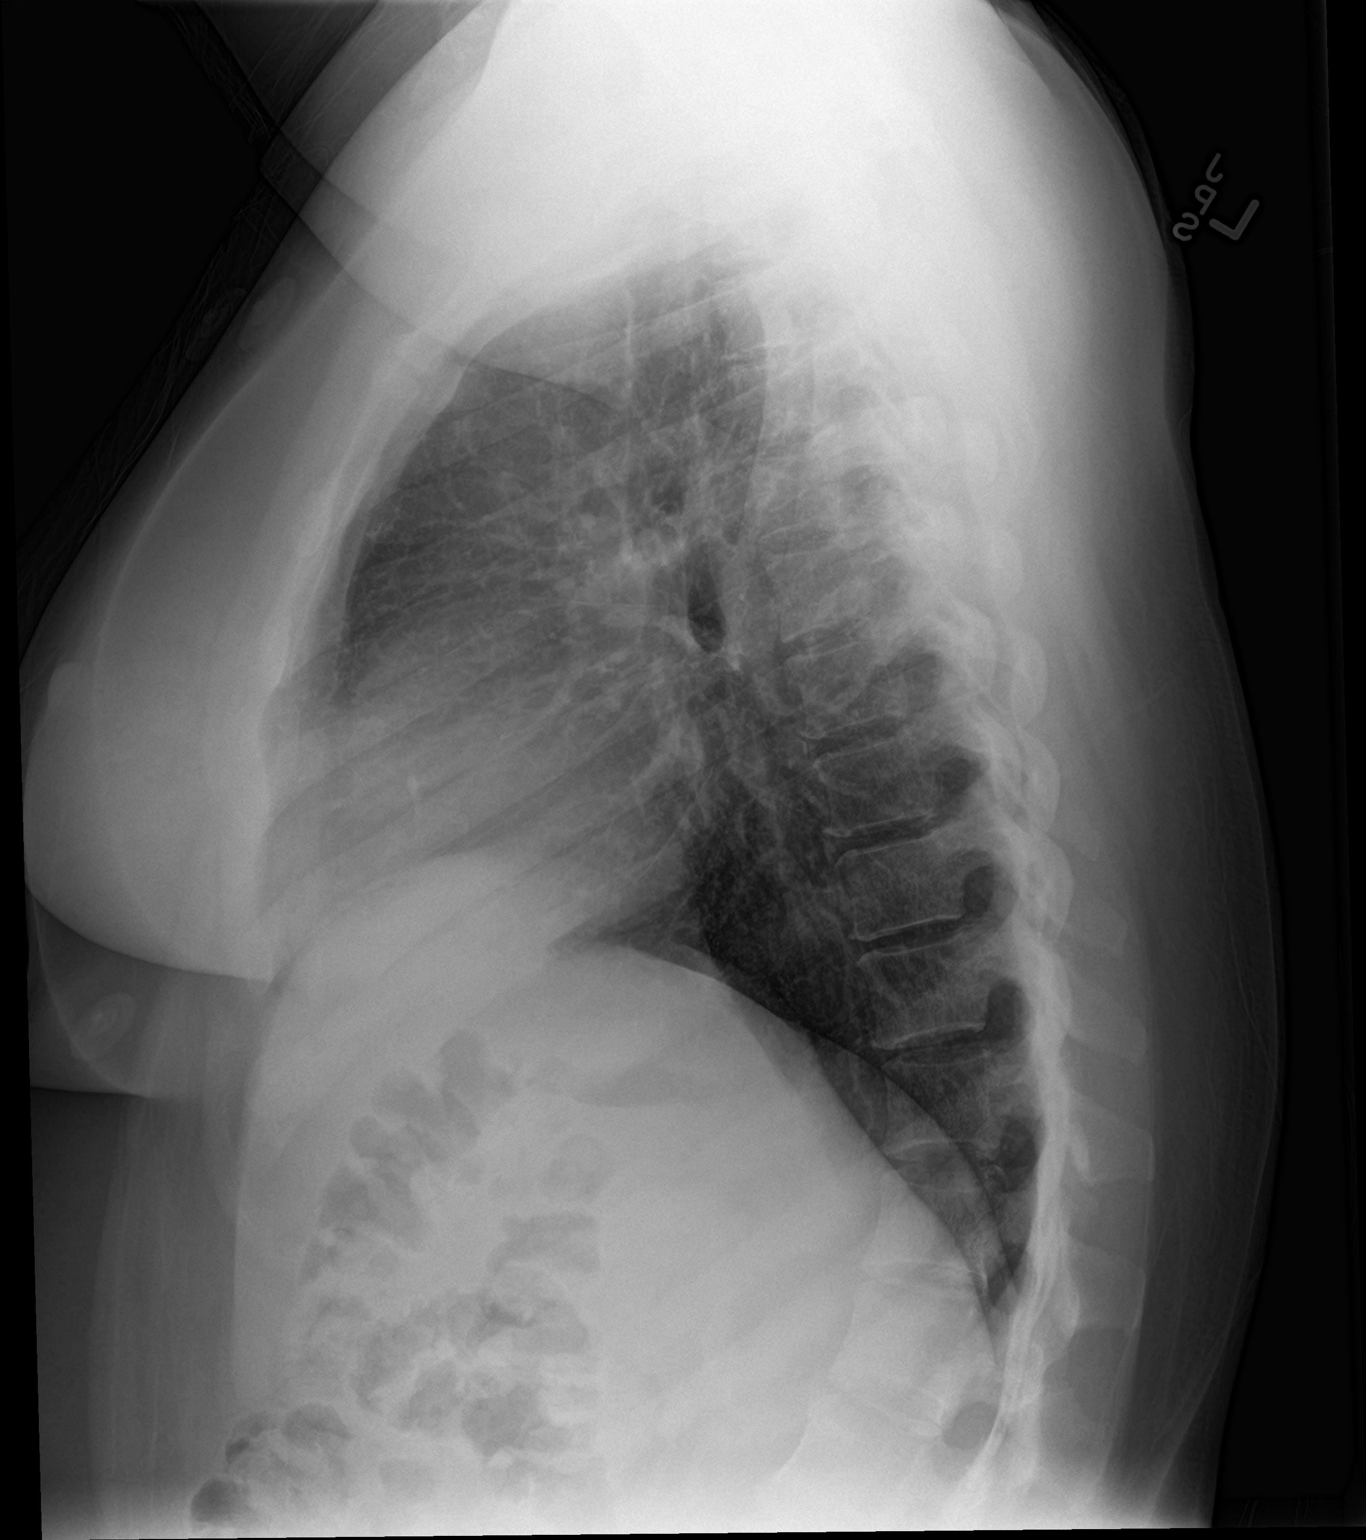

[2 of 2 positions shown; findings below may reference images not displayed]

FINDINGS: The heart size and mediastinal contours are within normal limits.
Both lungs are clear. The visualized skeletal structures are
unremarkable.
IMPRESSION: No active cardiopulmonary disease.

## 2018-09-08 ENCOUNTER — Encounter (HOSPITAL_COMMUNITY): Payer: Self-pay

## 2018-09-08 ENCOUNTER — Other Ambulatory Visit: Payer: Self-pay

## 2018-09-08 ENCOUNTER — Emergency Department (HOSPITAL_COMMUNITY)
Admission: EM | Admit: 2018-09-08 | Discharge: 2018-09-08 | Disposition: A | Discharge status: Home / Self Care | Payer: Self-pay | Attending: Emergency Medicine | Admitting: Emergency Medicine

## 2018-09-08 DIAGNOSIS — R21 Rash and other nonspecific skin eruption: Secondary | ICD-10-CM | POA: Insufficient documentation

## 2018-09-08 MED ORDER — PREDNISONE 20 MG PO TABS: 20.0000 mg | ORAL_TABLET | Freq: Every day | ORAL | 0 refills | Status: AC

## 2018-09-08 MED ORDER — CEPHALEXIN 500 MG PO CAPS: 500.0000 mg | ORAL_CAPSULE | Freq: Two times a day (BID) | ORAL | 0 refills | Status: AC

## 2018-09-08 MED ORDER — FAMOTIDINE 20 MG PO TABS: 20.0000 mg | ORAL_TABLET | Freq: Two times a day (BID) | ORAL | 0 refills | Status: AC

## 2018-09-08 NOTE — ED Triage Notes (Signed)
Patient went to clinic three weeks ago with complaints of a rash on the right arm and both sides of the neck. They gave her prednisone and cephalexin which helped; she has now run out and the rash returned. She complains of pins and needles in the rash site.

## 2018-09-08 NOTE — Discharge Instructions (Addendum)
Please read and follow all provided instructions.  1. Medications:  -Prescription sent to your pharmacy for Keflex.  This is an antibiotic.  Please take as prescribed. -Prescription also sent to your pharmacy for Pepcid.  Please take as prescribed, this should help with itching and hives -Prescription also sent for prednisone.  This is a steroid.  Please take as prescribed.   2. Treatment: rest, drink plenty of fluids, take medications as prescribed.  Please stop using lotions on the rash as we discussed  3. Follow Up: Please keep the follow-up appointment you have scheduled with [facility] physicians next week   Return to the ER for difficulty breathing, return of allergic reaction or other concerning symptoms

## 2018-09-08 NOTE — ED Provider Notes (Signed)
[facility]   CSN: [id number] Arrival date & time: 09/08/18  1711     History   Chief Complaint rash  HPI Alyssa StakesLinda D Moss is a 48 y.o. female presents emergency department today with chief complaint of rash.  Rash is located on bilateral upper extremities and her neck.  She states rash has been present x3 months.  [facility] and [facility] prednisone and Keflex on 08/19/18.  She states she took the medications as prescribed and finished them 1 week ago.  She admits to the rash improving while taking these medications however it has gotten worse over the last 3 days.  She reports itching and swelling to the rash, denies any discharge.  She has tried multiple over-the-counter creams and ointments without improvement.  She has an appointment [facility] with [facility] in 1 week for further evaluation of this rash.   Denies fever, chills, contacts with persons with similar rash, or any changes in soaps/detergents, exposure to animal or plant irritants, and denies swelling or purulent discharge. No recent travel. No recent tick bites. No involvement to palms/soles or between webspaces. Patient does not have history of immunocompromised. History provided by patient with additional history obtained from chart review.      Past Medical History:  Diagnosis Date  . Facial numbness     Patient Active Problem List   Diagnosis Date Noted  . 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 No pertinent surgical history.   OB History   No obstetric history on file.      Home Medications    Prior to Admission medications   Medication Sig Start Date End Date Taking? Authorizing Provider  cephALEXin (KEFLEX) 500 MG capsule Take 1 capsule (500 mg total) by mouth 2 (two) times daily for 7 days. 09/08/18 09/15/18  Albrizze, Kaitlyn E, PA-C  famotidine (PEPCID) 20 MG tablet Take 1 tablet (20 mg total) by mouth 2 (two)  times daily for 6 days. 09/08/18 09/14/18  Albrizze, Kaitlyn E, PA-C  ibuprofen (ADVIL,MOTRIN) 200 MG tablet Take 200 mg by mouth every 6 (six) hours as needed.    [provider]  predniSONE (DELTASONE) 20 MG tablet Take 1 tablet (20 mg total) by mouth daily for 5 days. 09/08/18 09/13/18  Albrizze, Caroleen HammanKaitlyn E, PA-C    Family History Family History  Problem Relation Age of Onset  . Stroke Mother 6553  . Diabetes Mother   . High blood pressure Father   . Healthy Sister   . Diabetes Maternal Grandmother   . Brain cancer Maternal Uncle   . Cancer Maternal Uncle     Social History Social History   Tobacco Use  . Smoking status: Never Smoker  . Smokeless tobacco: Never Used  Substance Use Topics  . Alcohol use: Yes    Alcohol/week: 2.0 standard drinks    Types: 2 Glasses of wine per week  . Drug use: No     Allergies   Grass extracts [gramineae pollens]   Review of Systems Review of Systems  Constitutional: Negative for chills, fatigue, fever and unexpected weight change.  HENT: Negative for congestion, facial swelling, mouth sores, sore throat and trouble swallowing.   Respiratory: Negative for shortness of breath.   Cardiovascular: Negative for chest pain.  Gastrointestinal: Negative for abdominal pain, nausea and vomiting.  Musculoskeletal: Negative for neck pain.  Skin: Positive for rash. Negative for wound.     Physical Exam  Updated Vital Signs BP 124/89 (BP Location: Right Arm)   Pulse (!) 103   Temp 98.5 F (36.9 C) (Oral)   Resp 14   Ht 5\' 5"  (1.651 m)   Wt 97.5 kg   SpO2 98%   BMI 35.78 kg/m   Physical Exam Vitals signs and nursing note reviewed.  Constitutional:      Appearance: She is well-developed. She is not ill-appearing or toxic-appearing.     Comments: Airway is patent, no angioedema.  HENT:     Head: Normocephalic and atraumatic.     Nose: Nose normal.  Eyes:     General: No scleral icterus.       Right eye: No discharge.        Left  eye: No discharge.     Conjunctiva/sclera: Conjunctivae normal.  Neck:     Musculoskeletal: Normal range of motion.     Vascular: No JVD.  Cardiovascular:     Rate and Rhythm: Regular rhythm. Tachycardia present.     Pulses: Normal pulses.     Heart sounds: Normal heart sounds.  Pulmonary:     Effort: Pulmonary effort is normal.     Breath sounds: Normal breath sounds.  Abdominal:     General: There is no distension.  Musculoskeletal: Normal range of motion.  Skin:    General: Skin is warm and dry.     Findings: Rash present.     Comments: Red raised rash to bilateral upper extremities and neck. Non tender to palpation. No drainage.   Neurological:     Mental Status: She is oriented to person, place, and time.     GCS: GCS eye subscore is 4. GCS verbal subscore is 5. GCS motor subscore is 6.     Comments: Fluent speech, no facial droop.  Psychiatric:        Behavior: Behavior normal.      ED Treatments / Results  Labs (all labs ordered are listed, but only abnormal results are displayed) Labs Reviewed - No data to display  EKG None  Radiology No results found.  Procedures Procedures (including critical care time)  Medications Ordered in ED Medications - No data to display   Initial Impression / Assessment and Plan / ED Course  I have reviewed the triage vital signs and the nursing notes.  Pertinent labs & imaging results that were available during my care of the patient were reviewed by me and considered in my medical decision making (see chart for details).  Patient seen and examined.Afebrile, tachycardic to 103 on arrival. Patient denies any difficulty breathing or swallowing.  Pt has a patent airway without stridor and is handling secretions without difficulty; no angioedema. Rash with nonspecific erruption. No signs of infection. Not tender to touch. No concern for superimposed infection. No concern for SJS, TEN, TSS, tick borne illness, syphilis or other  life-threatening condition. Recommend she stop trying otc creams and lotions as she has been trying many without relief. Will discharge with symptomatic treatment. Recommend she keep pcp follow up as scheduled for next week. Strict ED return precautions discussed. Tachycardia resolved at discharge.  Vitals:   09/08/18 1719 09/08/18 1740 09/08/18 1856  BP: 124/89  115/75  Pulse: (!) 103  80  Resp: 14  14  Temp: 98.5 F (36.9 C)    TempSrc: Oral    SpO2: 98%  97%  Weight:  97.5 kg   Height:  5\' 5"  (1.651 m)       This note  was prepared using Conservation officer, historic buildings and may include unintentional dictation errors due to the inherent limitations of voice recognition software.      Final Clinical Impressions(s) / ED Diagnoses   Final diagnoses:  Rash and nonspecific skin eruption    ED Discharge Orders         Ordered    predniSONE (DELTASONE) 20 MG tablet  Daily     09/08/18 1849    cephALEXin (KEFLEX) 500 MG capsule  2 times daily     09/08/18 1849    famotidine (PEPCID) 20 MG tablet  2 times daily     09/08/18 1849           Kathyrn Lass 09/08/18 2308    Jacalyn Lefevre, MD 09/08/18 2325

## 2019-09-19 ENCOUNTER — Other Ambulatory Visit: Payer: Self-pay | Admitting: Critical Care Medicine

## 2019-09-19 ENCOUNTER — Other Ambulatory Visit: Payer: Self-pay

## 2019-09-19 DIAGNOSIS — Z20822 Contact with and (suspected) exposure to covid-19: Secondary | ICD-10-CM

## 2019-09-21 LAB — SARS-COV-2, NAA 2 DAY TAT

## 2019-09-21 LAB — NOVEL CORONAVIRUS, NAA: SARS-CoV-2, NAA: DETECTED — AB

## 2019-09-22 ENCOUNTER — Encounter: Payer: Self-pay | Admitting: Family

## 2019-09-22 ENCOUNTER — Other Ambulatory Visit: Payer: Self-pay | Admitting: Family

## 2019-09-22 ENCOUNTER — Telehealth: Payer: Self-pay | Admitting: Family

## 2019-09-22 DIAGNOSIS — Z7189 Other specified counseling: Secondary | ICD-10-CM

## 2019-09-22 DIAGNOSIS — U071 COVID-19: Secondary | ICD-10-CM

## 2019-09-22 NOTE — Telephone Encounter (Signed)
I connected by phone with Alyssa Moss on 09/22/2019 at 12:08 PM to discuss the potential use of a new treatment for mild to moderate COVID-19 viral infection in non-hospitalized patients.  This patient is a 49 y.o. female that meets the FDA criteria for Emergency Use Authorization of COVID monoclonal antibody casirivimab/imdevimab.  Has a (+) direct SARS-CoV-2 viral test result  Has mild or moderate COVID-19   Is NOT hospitalized due to COVID-19  Is within 10 days of symptom onset  Has at least one of the high risk factor(s) for progression to severe COVID-19 and/or hospitalization as defined in EUA.  Specific high risk criteria : BMI > 25  Symptom onset: 09/16/19 cough, dizziness  Positive test: 09/19/19 I have spoken and communicated the following to the patient or parent/caregiver regarding COVID monoclonal antibody treatment:  1. FDA has authorized the emergency use for the treatment of mild to moderate COVID-19 in adults and pediatric patients with positive results of direct SARS-CoV-2 viral testing who are 58 years of age and older weighing at least 40 kg, and who are at high risk for progressing to severe COVID-19 and/or hospitalization.  2. The significant known and potential risks and benefits of COVID monoclonal antibody, and the extent to which such potential risks and benefits are unknown.  3. Information on available alternative treatments and the risks and benefits of those alternatives, including clinical trials.  4. Patients treated with COVID monoclonal antibody should continue to self-isolate and use infection control measures (e.g., wear mask, isolate, social distance, avoid sharing personal items, clean and disinfect "high touch" surfaces, and frequent handwashing) according to CDC guidelines.   5. The patient or parent/caregiver has the option to accept or refuse COVID monoclonal antibody treatment.  After reviewing this information with the patient, The patient agreed  to proceed with receiving casirivimab\imdevimab infusion and will be provided a copy of the Fact sheet prior to receiving the infusion. [name] 09/22/2019 12:08 PM

## 2019-09-23 ENCOUNTER — Ambulatory Visit (HOSPITAL_COMMUNITY)
Admission: RE | Admit: 2019-09-23 | Discharge: 2019-09-23 | Disposition: A | Discharge status: Home / Self Care | Payer: HRSA Program | Source: Ambulatory Visit | Attending: Pulmonary Disease | Admitting: Pulmonary Disease

## 2019-09-23 ENCOUNTER — Other Ambulatory Visit: Payer: Self-pay | Admitting: Family

## 2019-09-23 DIAGNOSIS — U071 COVID-19: Secondary | ICD-10-CM

## 2019-09-23 MED ORDER — SODIUM CHLORIDE 0.9 % IV SOLN: INTRAVENOUS | Status: DC | PRN

## 2019-09-23 MED ORDER — ALBUTEROL SULFATE HFA 108 (90 BASE) MCG/ACT IN AERS: 2.0000 | INHALATION_SPRAY | Freq: Once | RESPIRATORY_TRACT | Status: DC | PRN

## 2019-09-23 MED ORDER — SODIUM CHLORIDE 0.9 % IV SOLN
1200.0000 mg | Freq: Once | INTRAVENOUS | Status: AC
  Administered 2019-09-23: 1200 mg via INTRAVENOUS
  Filled 2019-09-23: qty 10

## 2019-09-23 MED ORDER — DIPHENHYDRAMINE HCL 50 MG/ML IJ SOLN: 50.0000 mg | Freq: Once | INTRAMUSCULAR | Status: DC | PRN

## 2019-09-23 MED ORDER — FAMOTIDINE IN NACL 20-0.9 MG/50ML-% IV SOLN: 20.0000 mg | Freq: Once | INTRAVENOUS | Status: DC | PRN

## 2019-09-23 MED ORDER — EPINEPHRINE 0.3 MG/0.3ML IJ SOAJ: 0.3000 mg | Freq: Once | INTRAMUSCULAR | Status: DC | PRN

## 2019-09-23 MED ORDER — METHYLPREDNISOLONE SODIUM SUCC 125 MG IJ SOLR: 125.0000 mg | Freq: Once | INTRAMUSCULAR | Status: DC | PRN

## 2019-09-23 NOTE — Discharge Instructions (Signed)

## 2019-09-23 NOTE — Addendum Note (Signed)
Addended by: [name] on: 09/23/2019 09:18 AM   Modules accepted: Orders, SmartSet

## 2019-09-23 NOTE — Progress Notes (Signed)
  Diagnosis: COVID-19  Physician: Dr. Wright   Procedure: [facility] Med: casirivimab\imdevimab infusion - Provided patient with casirivimab\imdevimab fact sheet for patients, parents and caregivers prior to infusion.  Complications: No immediate complications noted.  Discharge: Discharged home   Claudia Greenley  Bell 09/23/2019   

## 2022-03-04 ENCOUNTER — Telehealth: Payer: Self-pay

## 2022-03-04 NOTE — Telephone Encounter (Signed)
Mychart msg sent
